# Patient Record
Sex: Female | Born: 2000 | Race: White | Hispanic: No | Marital: Single | Smoking: Never smoker
Health system: Southern US, Community
[De-identification: ages and names within clinical notes are randomized; demographics above are authoritative.]

## PROBLEM LIST (undated history)

## (undated) DIAGNOSIS — L709 Acne, unspecified: Secondary | ICD-10-CM

---
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
## 2020-05-20 ENCOUNTER — Emergency Department (HOSPITAL_COMMUNITY)
Admission: EM | Admit: 2020-05-20 | Discharge: 2020-05-21 | Disposition: A | Discharge status: Home / Self Care | Payer: Medicaid Other | Attending: Emergency Medicine | Admitting: Emergency Medicine

## 2020-05-20 ENCOUNTER — Encounter (HOSPITAL_COMMUNITY): Payer: Self-pay | Admitting: Emergency Medicine

## 2020-05-20 ENCOUNTER — Other Ambulatory Visit: Payer: Self-pay

## 2020-05-20 DIAGNOSIS — R519 Headache, unspecified: Secondary | ICD-10-CM

## 2020-05-20 DIAGNOSIS — U071 COVID-19: Secondary | ICD-10-CM | POA: Diagnosis not present

## 2020-05-20 DIAGNOSIS — G44209 Tension-type headache, unspecified, not intractable: Secondary | ICD-10-CM | POA: Diagnosis not present

## 2020-05-20 LAB — URINALYSIS, ROUTINE W REFLEX MICROSCOPIC
Bacteria, UA: NONE SEEN
Bilirubin Urine: NEGATIVE
Glucose, UA: NEGATIVE mg/dL
Hgb urine dipstick: NEGATIVE
Ketones, ur: 20 mg/dL — AB
Leukocytes,Ua: NEGATIVE
Nitrite: NEGATIVE
Protein, ur: 30 mg/dL — AB
Specific Gravity, Urine: 1.031 — ABNORMAL HIGH (ref 1.005–1.030)
pH: 5 (ref 5.0–8.0)

## 2020-05-20 LAB — CBG MONITORING, ED
Glucose-Capillary: 160 mg/dL — ABNORMAL HIGH (ref 70–99)
Glucose-Capillary: 55 mg/dL — ABNORMAL LOW (ref 70–99)
Glucose-Capillary: 69 mg/dL — ABNORMAL LOW (ref 70–99)

## 2020-05-20 LAB — PREGNANCY, URINE: Preg Test, Ur: NEGATIVE

## 2020-05-20 LAB — SARS CORONAVIRUS 2 BY RT PCR (HOSPITAL ORDER, PERFORMED IN ~~LOC~~ HOSPITAL LAB): SARS Coronavirus 2: POSITIVE — AB

## 2020-05-20 MED ORDER — DIPHENHYDRAMINE HCL 50 MG/ML IJ SOLN
25.0000 mg | Freq: Once | INTRAMUSCULAR | Status: AC
  Administered 2020-05-20: 25 mg via INTRAVENOUS
  Filled 2020-05-20: qty 1

## 2020-05-20 MED ORDER — SODIUM CHLORIDE 0.9 % IV BOLUS
500.0000 mL | Freq: Once | INTRAVENOUS | Status: AC
  Administered 2020-05-20: 500 mL via INTRAVENOUS

## 2020-05-20 MED ORDER — METOCLOPRAMIDE HCL 5 MG/ML IJ SOLN
10.0000 mg | Freq: Once | INTRAMUSCULAR | Status: AC
  Administered 2020-05-20: 10 mg via INTRAVENOUS
  Filled 2020-05-20: qty 2

## 2020-05-20 NOTE — Discharge Instructions (Signed)
At this time there does not appear to be the presence of an emergent medical condition, however there is always the potential for conditions to change. Please read and follow the below instructions.  Please return to the Emergency Department immediately for any new or worsening symptoms. Please be sure to follow up with your Primary Care Provider within one week regarding your visit today; please call their office to schedule an appointment even if you are feeling better for a follow-up visit. Your Covid test was positive please continue to quarantine until August 13.  Drink plenty water and get plenty of rest.  Get help right away if: Your headache gets very bad quickly. Your headache gets worse after a lot of physical activity. You keep throwing up. You have a stiff neck. You have trouble seeing. You have trouble speaking. You have pain in the eye or ear. Your muscles are weak or you lose muscle control. You lose your balance or have trouble walking. You feel like you will pass out (faint) or you pass out. You are mixed up (confused). You have a seizure. You have any new/concerning or worsening of symptoms  Get help right away if: You have trouble breathing. You have pain or pressure in your chest. You have confusion. You have bluish lips and fingernails. You have difficulty waking from sleep. You have symptoms that get worse. These symptoms may represent a serious problem that is an emergency. Do not wait to see if the symptoms will go away. Get medical help right away. Call your local emergency services (911 in the [place] Do not drive yourself to the hospital. Let the emergency medical personnel know if you think you have COVID-19.  Please read the additional information packets attached to your discharge summary.  Do not take your medicine if  develop an itchy rash, swelling in your mouth or lips, or difficulty breathing; call 911 and seek immediate emergency medical attention if  this occurs.  You may review your lab tests and imaging results in their entirety on your MyChart account.  Please discuss all results of fully with your primary care provider and other specialist at your follow-up visit.  Note: Portions of this text may have been transcribed using voice recognition software. Every effort was made to ensure accuracy; however, inadvertent computerized transcription errors may still be present.

## 2020-05-20 NOTE — ED Triage Notes (Signed)
Pt c/o sinus headache since Monday, has been take otc meds with no relief.

## 2020-05-20 NOTE — ED Notes (Signed)
Reports sinus headache since Monday   Has PCP in [place] but has not contacted   Took OTC meds with no relief   Here for eval

## 2020-05-20 NOTE — ED Notes (Signed)
Mother enroute  Pt is positive for covid   Headache is gone   Feels much better

## 2020-05-20 NOTE — ED Provider Notes (Signed)
[facility] EMERGENCY DEPARTMENT Provider Note   CSN: [id number] Arrival date & time: 05/20/20  1900     History Chief Complaint  Patient presents with  . Headache    Tina Galvan is a 19 y.o. female otherwise healthy related to medication use. Patient presents today for headache onset Monday, 4 days ago. She describes bilateral aching sensation constant nonradiating worsened with bright light improved with rest and temporarily with OTC medications. She reports history of sinus headaches and reports this is consistent with similar.  Additionally patient reports decreased urination today would like evaluation for UTI.  Denies fever/chills, vision changes, numbness/tingling, weakness, difficulty speaking, neck stiffness, sore throat, chest pain/shortness of breath, cough, abdominal pain, nausea/vomiting, diarrhea, dysuria/hematuria, concern for STI or any additional concerns.  HPI     Past Medical History:  Diagnosis Date  . Acne     There are no problems to display for this patient.   History reviewed. No pertinent surgical history.   OB History   No obstetric history on file.     No family history on file.  Social History   Tobacco Use  . Smoking status: Never Smoker  . Smokeless tobacco: Never Used  Vaping Use  . Vaping Use: Never used  Substance Use Topics  . Alcohol use: No  . Drug use: No    Home Medications Prior to Admission medications   Medication Sig Start Date End Date Taking? Authorizing Provider  aspirin-acetaminophen-caffeine (EXCEDRIN MIGRAINE) [phone] MG tablet Take 2 tablets by mouth every 6 (six) hours as needed for headache.   Yes [provider]  medroxyPROGESTERone (DEPO-PROVERA) 150 MG/ML injection Inject 150 mg into the muscle every 4 (four) months.    Yes [provider]    Allergies    Patient has no known allergies.  Review of Systems   Review of Systems Ten systems are reviewed and are negative for acute  change except as noted in the HPI  Physical Exam Updated Vital Signs BP 107/74 (BP Location: Left Arm)   Pulse 78   Temp 98.8 F (37.1 C) (Temporal)   Resp 18   Ht 5\' 9"  (1.753 m)   Wt 65.8 kg   SpO2 100%   BMI 21.41 kg/m   Physical Exam Constitutional:      General: She is not in acute distress.    Appearance: Normal appearance. She is well-developed. She is not ill-appearing or diaphoretic.  HENT:     Head: Normocephalic and atraumatic.     Jaw: There is normal jaw occlusion.     Mouth/Throat:     Comments: The patient has normal phonation and is in control of secretions. No stridor.  Midline uvula without edema. Soft palate rises symmetrically. No tonsillar erythema, swelling or exudates. Tongue protrusion is normal, floor of mouth is soft. No trismus. No creptius on neck palpation. No gingival erythema or fluctuance noted. Mucus membranes moist. No pallor noted. Eyes:     General: Vision grossly intact. Gaze aligned appropriately.     Extraocular Movements: Extraocular movements intact.     Pupils: Pupils are equal, round, and reactive to light.  Neck:     Trachea: Trachea and phonation normal. No tracheal tenderness or tracheal deviation.     Meningeal: Brudzinski's sign absent.  Cardiovascular:     Rate and Rhythm: Normal rate and regular rhythm.  Pulmonary:     Effort: Pulmonary effort is normal. No respiratory distress.  Abdominal:     General: There is no  distension.     Palpations: Abdomen is soft.     Tenderness: There is no abdominal tenderness. There is no guarding or rebound.  Musculoskeletal:        General: Normal range of motion.     Cervical back: Normal range of motion and neck supple.  Skin:    General: Skin is warm and dry.  Neurological:     Mental Status: She is alert.     GCS: GCS eye subscore is 4. GCS verbal subscore is 5. GCS motor subscore is 6.     Comments: Mental Status: Alert, oriented, thought content appropriate, able to give a  coherent history. Speech fluent without evidence of aphasia. Able to follow 2 step commands without difficulty. Cranial Nerves: II: Peripheral visual fields grossly normal, pupils equal, round, reactive to light III,IV, VI: ptosis not present, extra-ocular motions intact bilaterally V,VII: smile symmetric, eyebrows raise symmetric, facial light touch sensation equal VIII: hearing grossly normal to voice X: uvula elevates symmetrically XI: bilateral shoulder shrug symmetric and strong XII: midline tongue extension without fassiculations Motor: Normal tone. 5/5 strength in upper and lower extremities bilaterally including strong and equal grip strength and dorsiflexion/plantar flexion Sensory: Sensation intact to light touch in all extremities.Negative Romberg.  No clonus of the feet. Cerebellar: normal finger-to-nose with bilateral upper extremities. Normal heel-to -shin balance bilaterally of the lower extremity. No pronator drift.  Gait: normal gait and balance CV: distal pulses palpable throughout  Psychiatric:        Behavior: Behavior normal.     ED Results / Procedures / Treatments   Labs (all labs ordered are listed, but only abnormal results are displayed) Labs Reviewed  SARS CORONAVIRUS 2 BY RT PCR (HOSPITAL ORDER, PERFORMED IN [facility]) - Abnormal; Notable for the following components:      Result Value   SARS Coronavirus 2 POSITIVE (*)    All other components within normal limits  URINALYSIS, ROUTINE W REFLEX MICROSCOPIC - Abnormal; Notable for the following components:   Specific Gravity, Urine 1.031 (*)    Ketones, ur 20 (*)    Protein, ur 30 (*)    All other components within normal limits  CBG MONITORING, ED - Abnormal; Notable for the following components:   Glucose-Capillary 69 (*)    All other components within normal limits  CBG MONITORING, ED - Abnormal; Notable for the following components:   Glucose-Capillary 55 (*)    All other  components within normal limits  PREGNANCY, URINE  CBG MONITORING, ED    EKG None  Radiology No results found.  Procedures Procedures (including critical care time)  Medications Ordered in ED Medications  diphenhydrAMINE (BENADRYL) injection 25 mg (25 mg Intravenous Given 05/20/20 2214)  metoCLOPramide (REGLAN) injection 10 mg (10 mg Intravenous Given 05/20/20 2214)  sodium chloride 0.9 % bolus 500 mL (0 mLs Intravenous Stopped 05/20/20 2244)    ED Course  I have reviewed the triage vital signs and the nursing notes.  Pertinent labs & imaging results that were available during my care of the patient were reviewed by me and considered in my medical decision making (see chart for details).    MDM Rules/Calculators/A&P                          Additional history obtained from: 1. Nursing notes from this visit. ------------------------- Tina Galvan is a 19 y.o. female who presents to ED for bilateral headache. Patient states that  their headache is consistent with their typical headache in the past. Physical Examination reassuring and without focal neuro deficits.no red flags such as sudden onset, syncope, neck stiffness, trauma, temporal artery pain or other abnormal features. No imaging indicated at this time. We will treat with migraine cocktail and fluids and reassess. UA ordered per patient's request for decreased urination today. Covid test ordered given current pandemic. ----- Urinalysis shows ketones and protein, suspect this is secondary to dehydration, fluid bolus given. CBG obtained for evaluation of hyperglycemia showed slightly decreased at 69. Patient has not eaten since arrival will give some food and recheck CBG.  Patient is nondiabetic.  Discussed with Dr. Deretha Emory who agrees. Pregnancy test negative. Covid test positive.  On reevaluation patient resting comfortably no acute distress complete resolution of headache following Benadryl and Reglan.  She denies any  neurologic symptoms/complaints.  No evidence of meningitis or other emergent pathologies at this time.  Reassuring examination.  Plan of care is discharged with PCP follow-up.  Discussed return precautions with patient at length. Diagnosis was discussed with patient who verbalizes understanding of care plan and is agreeable to discharge.   Repeat CBG was obtained and showed 55, I addressed this with staff, it appears they obtained a CBG prior to giving the patient any thing to eat or drink.  Will recheck after snack.  Care handoff give to Dr. Clayborne Dana, plan of care is recheck CBG and discharge if improved. .  Patient's case discussed with Dr. Deretha Emory who agrees with plan to discharge with follow-up.   Tina Galvan was evaluated in Emergency Department on 05/20/2020 for the symptoms described in the history of present illness. She was evaluated in the context of the global COVID-19 pandemic, which necessitated consideration that the patient might be at risk for infection with the SARS-CoV-2 virus that causes COVID-19. Institutional protocols and algorithms that pertain to the evaluation of patients at risk for COVID-19 are in a state of rapid change based on information released by regulatory bodies including the CDC and federal and state organizations. These policies and algorithms were followed during the patient's care in the ED.   Note: Portions of this report may have been transcribed using voice recognition software. Every effort was made to ensure accuracy; however, inadvertent computerized transcription errors may still be present. Final Clinical Impression(s) / ED Diagnoses Final diagnoses:  COVID-19  Acute nonintractable headache, unspecified headache type    Rx / DC Orders ED Discharge Orders    None       Elizabeth Palau 05/20/20 2347    Vanetta Mulders, MD 05/24/20 [phone]

## 2020-05-20 NOTE — ED Notes (Signed)
Report to Ginger, RN 

## 2020-05-20 NOTE — ED Notes (Signed)
Pt drinking soda   Second CBG   Mother is in parking lot
